# Patient Record
Sex: Female | Born: 1960 | Race: White | Hispanic: No | Marital: Single
Health system: Southern US, Community
[De-identification: ages and names within clinical notes are randomized; demographics above are authoritative.]

## PROBLEM LIST (undated history)

## (undated) DIAGNOSIS — E119 Type 2 diabetes mellitus without complications: Secondary | ICD-10-CM

## (undated) HISTORY — DX: Type 2 diabetes mellitus without complications: E11.9

---

## 2020-05-12 ENCOUNTER — Other Ambulatory Visit: Payer: Self-pay

## 2020-05-12 ENCOUNTER — Emergency Department (HOSPITAL_COMMUNITY)
Admission: EM | Admit: 2020-05-12 | Discharge: 2020-05-13 | Disposition: A | Discharge status: Home / Self Care | Payer: 59 | Attending: Emergency Medicine | Admitting: Emergency Medicine

## 2020-05-12 ENCOUNTER — Encounter (HOSPITAL_COMMUNITY): Payer: Self-pay

## 2020-05-12 ENCOUNTER — Emergency Department (HOSPITAL_COMMUNITY): Payer: 59

## 2020-05-12 DIAGNOSIS — E119 Type 2 diabetes mellitus without complications: Secondary | ICD-10-CM | POA: Diagnosis not present

## 2020-05-12 DIAGNOSIS — R0789 Other chest pain: Secondary | ICD-10-CM | POA: Diagnosis not present

## 2020-05-12 DIAGNOSIS — R05 Cough: Secondary | ICD-10-CM | POA: Diagnosis not present

## 2020-05-12 NOTE — ED Triage Notes (Signed)
Patient complains of 2 weeks of cough and now complains of left side/ rib pain with the cough. Has been vaccinated. No resp. Problems. Alert and oriented, NAD

## 2020-05-13 ENCOUNTER — Encounter (HOSPITAL_COMMUNITY): Payer: Self-pay | Admitting: Emergency Medicine

## 2020-05-13 MED ORDER — KETOROLAC TROMETHAMINE 30 MG/ML IJ SOLN
30.0000 mg | Freq: Once | INTRAMUSCULAR | Status: AC
  Administered 2020-05-13: 30 mg via INTRAMUSCULAR
  Filled 2020-05-13: qty 1

## 2020-05-13 MED ORDER — HYDROCODONE-ACETAMINOPHEN 5-325 MG PO TABS: 1.0000 | ORAL_TABLET | Freq: Four times a day (QID) | ORAL | 0 refills | Status: AC | PRN

## 2020-05-13 NOTE — ED Provider Notes (Signed)
[facility] EMERGENCY DEPARTMENT Provider Note   CSN: [id number] Arrival date & time: 05/12/20  0349     History No chief complaint on file.   Chelsea King is a 59 y.o. female.  HPI Patient presents with sharp left-sided chest pain.  Began around 2 weeks ago.  Began acutely with a cough.  States she has had a cough since she had Covid last year.  She has had acute sharp pain since.  Worse with certain movements.  Worse with deep breaths.  Worse with pressing on her chest.  Does not really feel short of breath but does have increased pain.  No hemoptysis.  No swelling in her legs.  Does not smoke.    Past Medical History:  Diagnosis Date  . Diabetes mellitus without complication (HCC)     There are no problems to display for this patient.   History reviewed. No pertinent surgical history.   OB History   No obstetric history on file.     History reviewed. No pertinent family history.  Social History   Tobacco Use  . Smoking status: Not on file  Substance Use Topics  . Alcohol use: Not on file  . Drug use: Not on file    Home Medications Prior to Admission medications   Not on File    Allergies    Patient has no known allergies.  Review of Systems   Review of Systems  Constitutional: Negative for fever.  Respiratory: Positive for cough. Negative for shortness of breath.   Cardiovascular: Positive for chest pain.  Gastrointestinal: Negative for abdominal pain.  Musculoskeletal: Negative for back pain.  Neurological: Negative for weakness.  Psychiatric/Behavioral: Negative for confusion.    Physical Exam Updated Vital Signs BP 125/69 (BP Location: Left Arm)   Pulse 80   Temp 98.2 F (36.8 C) (Oral)   Resp 20   Ht 5' 6.5" (1.689 m)   Wt 108.4 kg   SpO2 98%   BMI 38.00 kg/m   Physical Exam Vitals and nursing note reviewed.  HENT:     Head: Atraumatic.  Eyes:     Pupils: Pupils are equal, round, and reactive to light.    Cardiovascular:     Rate and Rhythm: Regular rhythm.  Pulmonary:     Comments: Point tender to left anterior lower chest wall.  No crepitance or deformity.  No rash.  No subcu emphysema. Chest:     Chest wall: Tenderness present.  Abdominal:     Tenderness: There is no abdominal tenderness.  Musculoskeletal:     Right lower leg: No edema.     Left lower leg: No edema.  Skin:    General: Skin is warm.     Capillary Refill: Capillary refill takes less than 2 seconds.  Neurological:     Mental Status: She is alert and oriented to person, place, and time.     ED Results / Procedures / Treatments   Labs (all labs ordered are listed, but only abnormal results are displayed) Labs Reviewed - No data to display  EKG None  Radiology DG Chest 2 View  Result Date: 05/12/2020 CLINICAL DATA:  Cough EXAM: CHEST - 2 VIEW COMPARISON:  None. FINDINGS: There is mild left base atelectasis. Lungs elsewhere are clear. Heart size and pulmonary vascularity are normal. No adenopathy. There are surgical clips in the right upper quadrant. No pneumothorax. No bone lesions. IMPRESSION: There is mild left base atelectasis. Lungs elsewhere clear. Heart size normal.  No adenopathy. Electronically Signed   By: Bretta Bang III M.D.   On: 05/12/2020 10:00    Procedures Procedures (including critical care time)  Medications Ordered in ED Medications  ketorolac (TORADOL) 30 MG/ML injection 30 mg (30 mg Intramuscular Given 05/13/20 0456)    ED Course  I have reviewed the triage vital signs and the nursing notes.  Pertinent labs & imaging results that were available during my care of the patient were reviewed by me and considered in my medical decision making (see chart for details).    MDM Rules/Calculators/A&P                          Patient with likely chest wall pain.  Began acutely with a cough.  Felt a pop in her chest.  Chest x-ray reassuring.  No pneumonia.  Will treat symptomatically.  Has  an incentive spirometer at home back from when she had Covid.  Will give work note and pain medicine. Final Clinical Impression(s) / ED Diagnoses Final diagnoses:  None    Rx / DC Orders ED Discharge Orders    None       Benjiman Core, MD 05/13/20 [phone]

## 2020-12-31 IMAGING — DX DG CHEST 2V
2 series · 2 of 2 positions shown · non-contrast
Comparison: None.

CLINICAL DATA: Cough

EXAM:
CHEST - 2 VIEW

[chest pa]
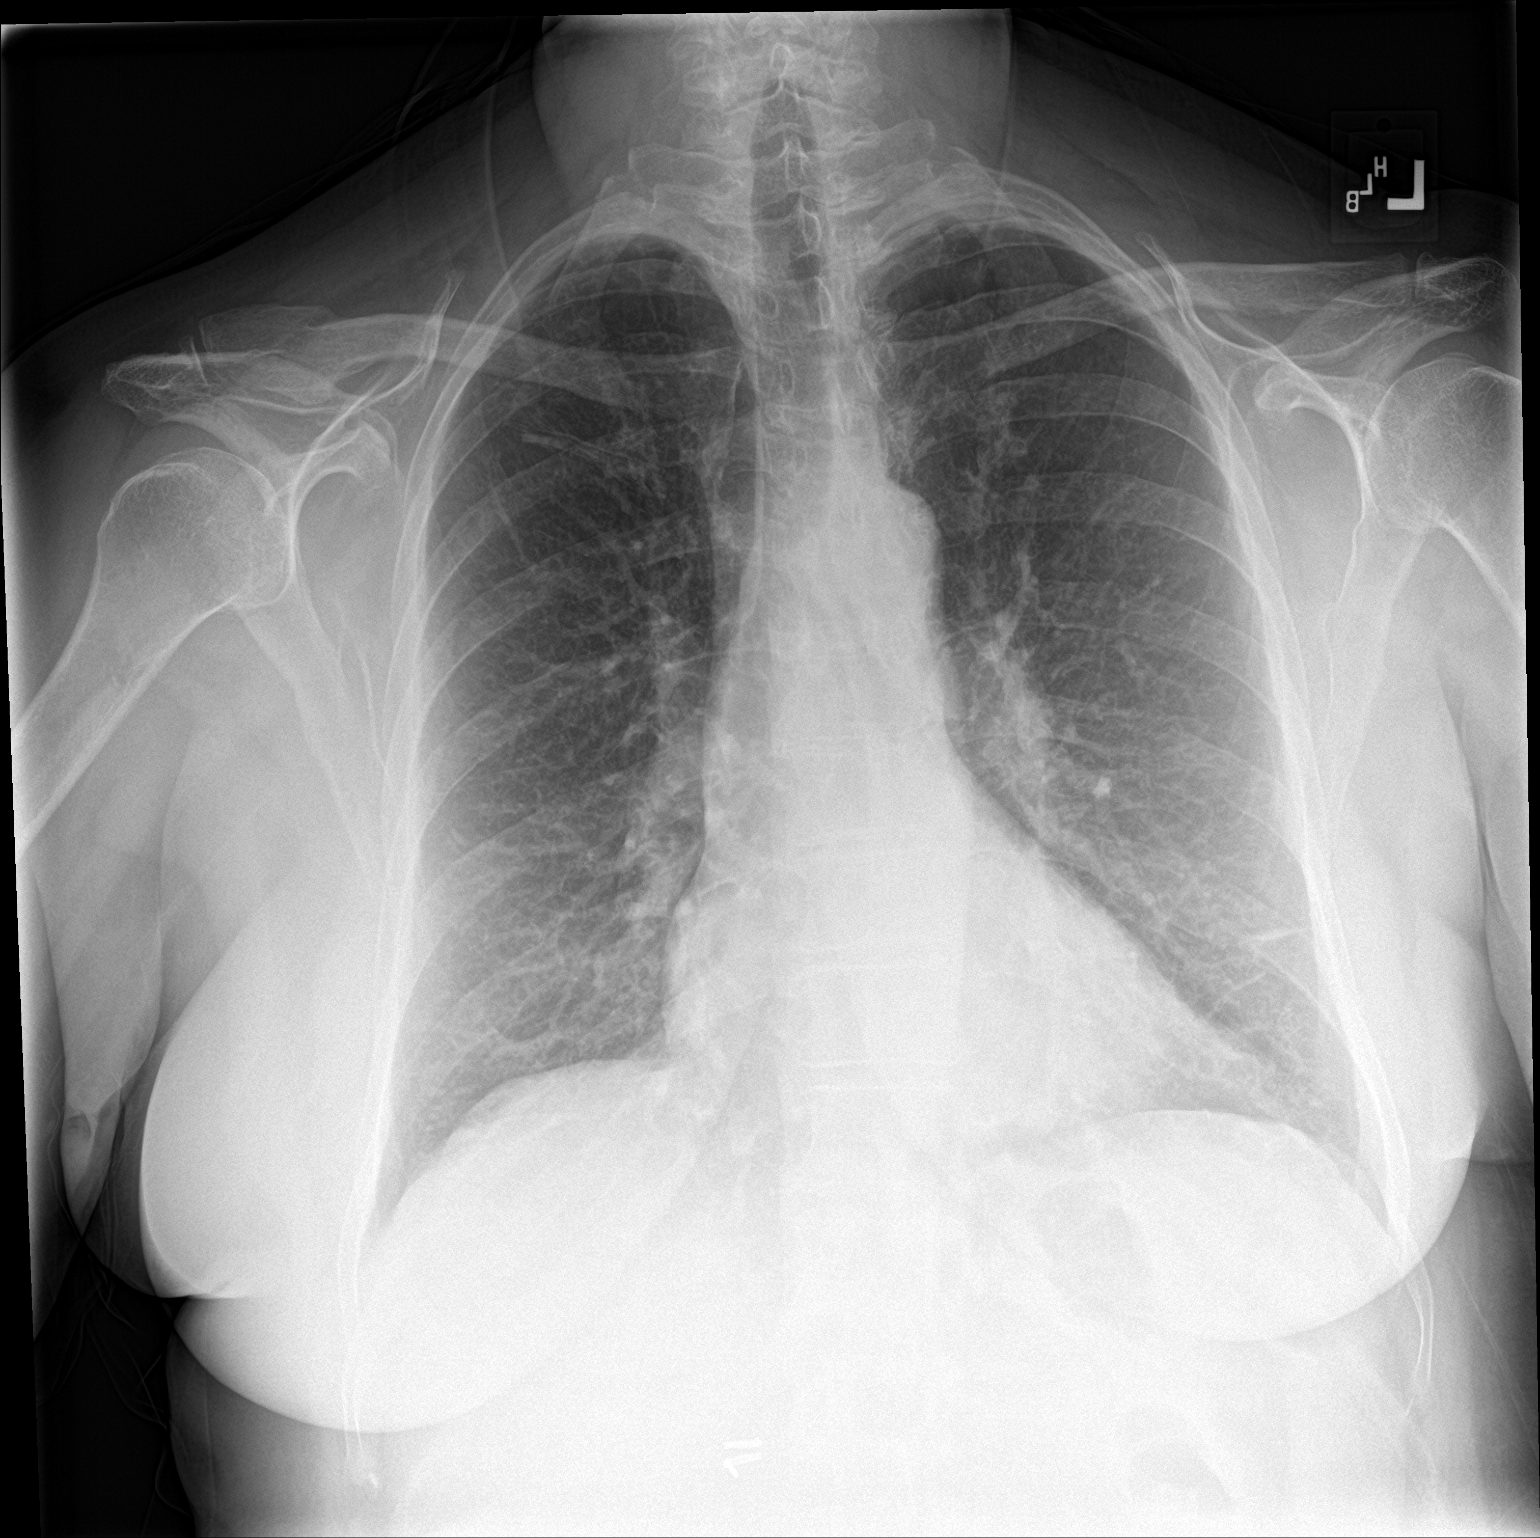

[chest lat]
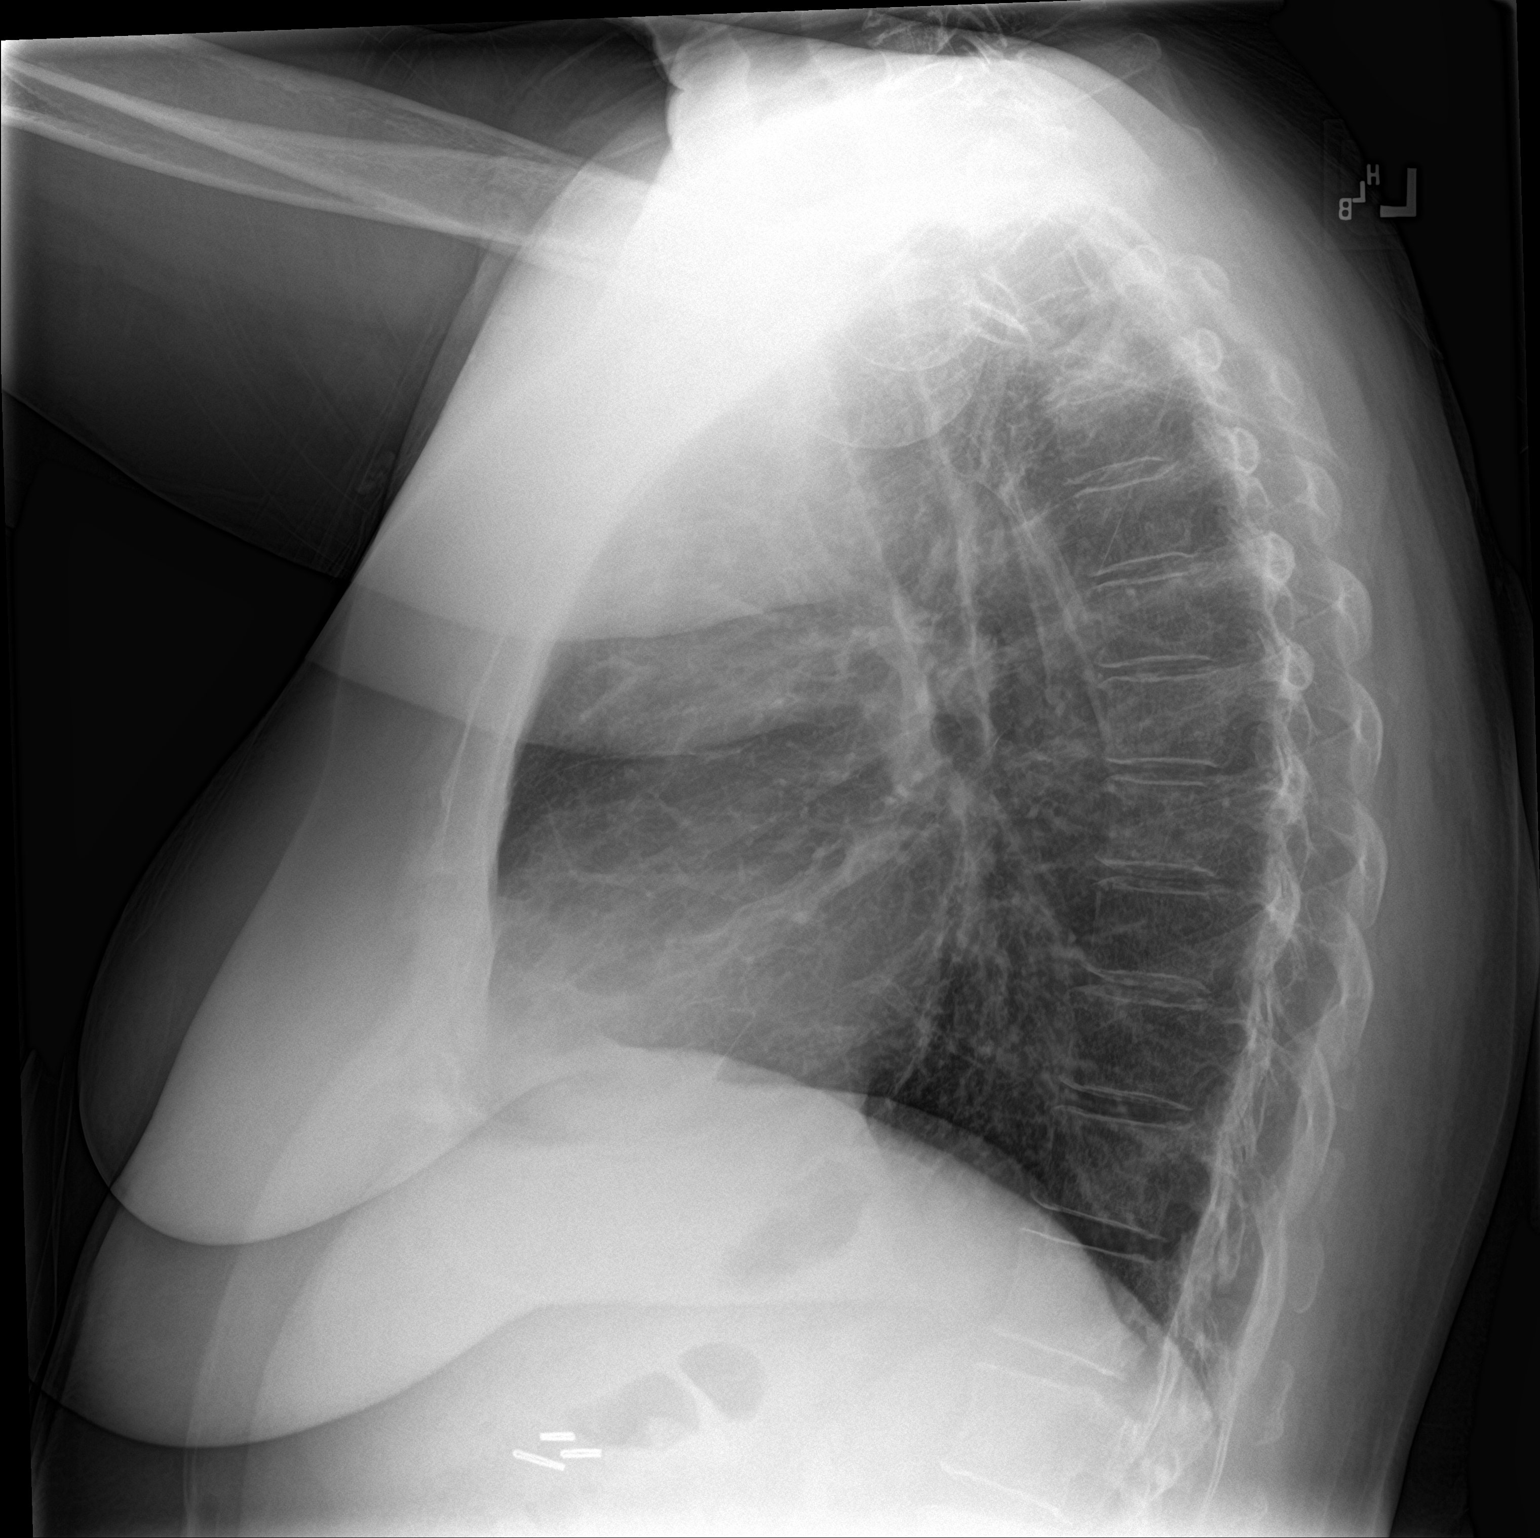

[2 of 2 positions shown; findings below may reference images not displayed]

FINDINGS: There is mild left base atelectasis. Lungs elsewhere are clear.
Heart size and pulmonary vascularity are normal. No adenopathy.
There are surgical clips in the right upper quadrant. No
pneumothorax. No bone lesions.
IMPRESSION: There is mild left base atelectasis. Lungs elsewhere clear. Heart
size normal. No adenopathy.
